# Patient Record
Sex: Female | Born: 2005 | Race: Black or African American | Hispanic: No | Marital: Single | Smoking: Never smoker
Health system: Southern US, Community
[De-identification: ages and names within clinical notes are randomized; demographics above are authoritative.]

## PROBLEM LIST (undated history)

## (undated) DIAGNOSIS — Z789 Other specified health status: Secondary | ICD-10-CM

## (undated) HISTORY — PX: NO PAST SURGERIES: SHX2092

## (undated) HISTORY — DX: Other specified health status: Z78.9

---
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
## 2023-10-15 ENCOUNTER — Encounter: Payer: Self-pay | Admitting: Emergency Medicine

## 2023-10-15 ENCOUNTER — Ambulatory Visit
Admission: EM | Admit: 2023-10-15 | Discharge: 2023-10-15 | Disposition: A | Discharge status: Home / Self Care | Payer: BLUE CROSS/BLUE SHIELD | Attending: Internal Medicine | Admitting: Internal Medicine

## 2023-10-15 DIAGNOSIS — J029 Acute pharyngitis, unspecified: Secondary | ICD-10-CM | POA: Diagnosis present

## 2023-10-15 DIAGNOSIS — B349 Viral infection, unspecified: Secondary | ICD-10-CM | POA: Diagnosis present

## 2023-10-15 DIAGNOSIS — R051 Acute cough: Secondary | ICD-10-CM | POA: Diagnosis not present

## 2023-10-15 LAB — POC COVID19/FLU A&B COMBO
Covid Antigen, POC: NEGATIVE
Influenza A Antigen, POC: NEGATIVE
Influenza B Antigen, POC: NEGATIVE

## 2023-10-15 LAB — POCT RAPID STREP A (OFFICE): Rapid Strep A Screen: NEGATIVE

## 2023-10-15 MED ORDER — PROMETHAZINE-DM 6.25-15 MG/5ML PO SYRP: 5.0000 mL | ORAL_SOLUTION | Freq: Four times a day (QID) | ORAL | 0 refills | Status: DC | PRN

## 2023-10-15 NOTE — ED Triage Notes (Signed)
Patient c/o nasal drainage, sore throat, headache, body ache, fever x 2 days.  Patient has taken Thera-Flu and BC Headache powder.

## 2023-10-15 NOTE — ED Provider Notes (Signed)
[facility]    CSN: [id number] Arrival date & time: 10/15/23  1118      History   Chief Complaint Chief Complaint  Patient presents with   Sore Throat    HPI Amber Everett is a 17 y.o. female  presents for evaluation of URI symptoms for 2 days. Patient reports associated symptoms of cough, congestion, sore throat, body aches, headache, low-grade fevers. Denies N/V/D, ear pain, shortness of breath. Patient does not have a hx of asthma. Patient is not an active smoker. Pt has taken BC powder and TheraFlu OTC for symptoms. Pt has no other concerns at this time.    Sore Throat Associated symptoms include headaches.    Past Medical History:  Diagnosis Date   No pertinent past medical history     There are no active problems to display for this patient.   Past Surgical History:  Procedure Laterality Date   NO PAST SURGERIES      OB History     Gravida  0   Para  0   Term  0   Preterm  0   AB  0   Living  0      SAB  0   IAB  0   Ectopic  0   Multiple  0   Live Births  0            Home Medications    Prior to Admission medications   Medication Sig Start Date End Date Taking? Authorizing Provider  promethazine-dextromethorphan (PROMETHAZINE-DM) 6.25-15 MG/5ML syrup Take 5 mLs by mouth 4 (four) times daily as needed for cough. 10/15/23  Yes Radford Pax, NP    Family History Family History  Problem Relation Age of Onset   Diabetes Mother    Heart failure Maternal Grandmother    Diabetes Maternal Grandmother    Cancer Maternal Grandfather        pancreatic    Social History Social History   Tobacco Use   Smoking status: Former    Types: Cigarettes   Smokeless tobacco: Never  Vaping Use   Vaping status: Never Used  Substance Use Topics   Alcohol use: No   Drug use: Never     Allergies   Patient has no known allergies.   Review of Systems Review of Systems  Constitutional:  Positive for fever.  HENT:   Positive for congestion and sore throat.   Respiratory:  Positive for cough.   Musculoskeletal:  Positive for myalgias.  Neurological:  Positive for headaches.     Physical Exam Triage Vital Signs ED Triage Vitals  Encounter Vitals Group     BP 10/15/23 1145 111/70     Systolic BP Percentile --      Diastolic BP Percentile --      Pulse Rate 10/15/23 1145 69     Resp 10/15/23 1145 18     Temp 10/15/23 1145 98.8 F (37.1 C)     Temp Source 10/15/23 1145 Oral     SpO2 10/15/23 1145 99 %     Weight 10/15/23 1146 110 lb (49.9 kg)     Height 10/15/23 1146 5\' 6"  (1.676 m)     Head Circumference --      Peak Flow --      Pain Score 10/15/23 1146 0     Pain Loc --      Pain Education --      Exclude from Growth Chart --    No  data found.  Updated Vital Signs BP 111/70 (BP Location: Right Arm)   Pulse 69   Temp 98.8 F (37.1 C) (Oral)   Resp 18   Ht 5\' 6"  (1.676 m)   Wt 110 lb (49.9 kg)   LMP 09/17/2023 (Exact Date)   SpO2 99%   BMI 17.75 kg/m   Visual Acuity Right Eye Distance:   Left Eye Distance:   Bilateral Distance:    Right Eye Near:   Left Eye Near:    Bilateral Near:     Physical Exam Vitals and nursing note reviewed.  Constitutional:      General: She is not in acute distress.    Appearance: She is well-developed. She is not ill-appearing.  HENT:     Head: Normocephalic and atraumatic.     Right Ear: Tympanic membrane and ear canal normal.     Left Ear: Tympanic membrane and ear canal normal.     Nose: Congestion present.     Mouth/Throat:     Mouth: Mucous membranes are moist.     Pharynx: Oropharynx is clear. Uvula midline. Posterior oropharyngeal erythema present.     Tonsils: No tonsillar exudate or tonsillar abscesses.  Eyes:     Conjunctiva/sclera: Conjunctivae normal.     Pupils: Pupils are equal, round, and reactive to light.  Cardiovascular:     Rate and Rhythm: Normal rate and regular rhythm.     Heart sounds: Normal heart sounds.   Pulmonary:     Effort: Pulmonary effort is normal.     Breath sounds: Normal breath sounds.  Musculoskeletal:     Cervical back: Normal range of motion and neck supple.  Lymphadenopathy:     Cervical: No cervical adenopathy.  Skin:    General: Skin is warm and dry.  Neurological:     General: No focal deficit present.     Mental Status: She is alert and oriented to person, place, and time.  Psychiatric:        Mood and Affect: Mood normal.        Behavior: Behavior normal.      UC Treatments / Results  Labs (all labs ordered are listed, but only abnormal results are displayed) Labs Reviewed  CULTURE, GROUP A STREP [facility])  POCT RAPID STREP A (OFFICE)  POC COVID19/FLU A&B COMBO    EKG   Radiology No results found.  Procedures Procedures (including critical care time)  Medications Ordered in UC Medications - No data to display  Initial Impression / Assessment and Plan / UC Course  I have reviewed the triage vital signs and the nursing notes.  Pertinent labs & imaging results that were available during my care of the patient were reviewed by me and considered in my medical decision making (see chart for details).     Reviewed exam and symptoms with patient.  No red flags.  Negative rapid flu, COVID, strep testing.  Will send strep throat culture.  Discussed viral illness and symptomatic treatment.  Promethazine DM as needed for cough, side effect profile reviewed.  PCP follow-up as symptoms do not improve.  ER precautions reviewed. Final Clinical Impressions(s) / UC Diagnoses   Final diagnoses:  Sore throat  Acute cough  Viral illness     Discharge Instructions      Start Promethazine DM as needed for cough.  Please of this medication make you drowsy.  Do not drink alcohol or drive on this medication.  Lots of rest and fluids.  Continue Tylenol  or ibuprofen as needed for fever management.  Please follow-up with your PCP if your symptoms do not improve.   Please go to the ER if you develop any worsening symptoms.  I hope you feel better soon!     ED Prescriptions     Medication Sig Dispense Auth. Provider   promethazine-dextromethorphan (PROMETHAZINE-DM) 6.25-15 MG/5ML syrup Take 5 mLs by mouth 4 (four) times daily as needed for cough. 118 mL Radford Pax, NP      PDMP not reviewed this encounter.   Radford Pax, NP 10/15/23 [phone]

## 2023-10-15 NOTE — Discharge Instructions (Addendum)
Start Promethazine DM as needed for cough.  Please of this medication make you drowsy.  Do not drink alcohol or drive on this medication.  Lots of rest and fluids.  Continue Tylenol or ibuprofen as needed for fever management.  Please follow-up with your PCP if your symptoms do not improve.  Please go to the ER if you develop any worsening symptoms.  I hope you feel better soon!

## 2023-10-18 LAB — CULTURE, GROUP A STREP (THRC)

## 2024-05-17 ENCOUNTER — Other Ambulatory Visit: Payer: Self-pay

## 2024-05-17 ENCOUNTER — Ambulatory Visit (INDEPENDENT_AMBULATORY_CARE_PROVIDER_SITE_OTHER): Admitting: Obstetrics and Gynecology

## 2024-05-17 VITALS — BP 105/66 | HR 78 | Ht 66.25 in | Wt 116.9 lb

## 2024-05-17 DIAGNOSIS — O219 Vomiting of pregnancy, unspecified: Secondary | ICD-10-CM

## 2024-05-17 DIAGNOSIS — Z3A01 Less than 8 weeks gestation of pregnancy: Secondary | ICD-10-CM

## 2024-05-17 DIAGNOSIS — Z3201 Encounter for pregnancy test, result positive: Secondary | ICD-10-CM | POA: Diagnosis not present

## 2024-05-17 LAB — POCT PREGNANCY, URINE: Preg Test, Ur: POSITIVE — AB

## 2024-05-17 MED ORDER — PROMETHAZINE HCL 25 MG PO TABS: 25.0000 mg | ORAL_TABLET | Freq: Four times a day (QID) | ORAL | 0 refills | Status: AC | PRN

## 2024-05-17 NOTE — Patient Instructions (Addendum)
 Prenatal Care Providers           Center for [facility] @ [facility]  [address] [phone]  Center for [facility] @ [facility]   [address]  [phone]  Center For [facility] @ [facility]       [address] [phone]            Center for [facility] @ [place]     [phone]          Center for [facility] @ [facility]   [address] [phone]  Center for [facility] @ [facility]  [address] [phone]     Center for [facility] @ [address] [place])  [address]   [phone]     [facility]  Phone: [phone]  [facility]  Phone: [phone]  [facility] Phone: [phone]  [facility] Phone: [phone]  [facility] Phone: [phone]  [facility] Phone: [phone]  [facility]  Phone: [phone]    CenteringPregnancy is a model of prenatal care that started 30 years ago and is used in about 600 practices around the [place] . You meet with a group of 8-12 women due around the same time as you. In Centering you will have individual time with the provider and meet as a group. There's much more time for discussion and learning. You will actually have much more time with your provider in Centering than in traditional prenatal care.? You will come directly into the Centering room and will not wait in the lobby so there is no wasted time. You will have 2-hour visits every 4 weeks then every 2 weeks. You will know your Centering prenatal appointments in advance. In your last month of pregnancy, you may also come in for some individual visits. Additional appointments can be scheduled if you need more care. Studies have shown that CenteringPregnancy improves birth outcomes. We have seen especially big  improvements in fewer Black women delivering babies who are too small or born too early. Visit the website CenteringHealthcare for more information. Let your provider or clinic staff know if you want to sign up or email [email or web address] for more information.   CenteringPregnancy Video   First Trimester of Pregnancy  The first trimester of pregnancy starts on the first day of your last monthly period until the end of week 13. This is months 1 through 3 of pregnancy. A week after a sperm fertilizes an egg, the egg will implant into the wall of the uterus and begin to develop into a baby. Body changes during your first trimester Your body goes through many changes during pregnancy. The changes usually return to normal after your baby is born. Physical changes Your breasts may grow larger and may hurt. The area around your nipples may get darker. Your periods will stop. Your hair and nails may grow faster. You may pee more often. Health changes You may tire easily. Your gums may bleed and may be sensitive when you brush and floss. You may not feel hungry. You may have heartburn. You may throw up or feel like you may throw up. You may want to eat some foods, but not others. You may have headaches. You may have trouble pooping (constipation). Other changes Your emotions may change from day to day. You may have more dreams. Follow these instructions  at home: Medicines Talk to your health care provider if you're taking medicines. Ask if the medicines are safe to take during pregnancy. Your provider may change the medicines that you take. Do not take any medicines unless told to by your provider. Take a prenatal vitamin that has at least 600 micrograms (mcg) of folic acid. Do not use herbal medicines, illegal substances, or medicines that are not approved by your provider. Eating and drinking While you're pregnant your body needs extra food for your growing baby. Talk with  your provider about what to eat while pregnant. Activity Most women are able to exercise during pregnancy. Exercises may need to change as your pregnancy goes on. Talk to your provider about your activities and exercise routines. Relieving pain and discomfort Wear a good, supportive bra if your breasts hurt. Rest with your legs raised if you have leg cramps or low back pain. Safety Wear your seatbelt at all times when you're in a car. Talk to your provider if someone hits you, hurts you, or yells at you. Talk with your provider if you're feeling sad or have thoughts of hurting yourself. Lifestyle Certain things can be harmful while you're pregnant. Follow these rules: Do not use hot tubs, steam rooms, or saunas. Do not douche. Do not use tampons or scented pads. Do not drink alcohol,smoke, vape, or use products with nicotine or tobacco in them. If you need help quitting, talk with your provider. Avoid cat litter boxes and soil used by cats. These things carry germs that can cause harm to your pregnancy and your baby. General instructions Keep all follow-up visits. It helps you and your unborn baby stay as healthy as possible. Write down your questions. Take them to your visits. Your provider will: Talk with you about your overall health. Give you advice or refer you to specialists who can help with different needs, including: Prenatal education classes. Mental health and counseling. Foods and healthy eating. Ask for help if you need help with food. Call your dentist and ask to be seen. Brush your teeth with a soft toothbrush. Floss gently. Where to find more information American Pregnancy Association: americanpregnancy.org [employer] of [employer]stetricians and Gynecologists: acog.org Office on [employer] Health: TravelLesson.ca Contact a health care provider if: You feel dizzy, faint, or have a fever. You vomit or have watery poop (diarrhea) for 2 days or more. You have abnormal  discharge or bleeding from your vagina. You have pain when you pee or your pee smells bad. You have cramps, pain, or pressure in your belly area. Get help right away if: You have trouble breathing or chest pain. You have any kind of injury, such as from a fall or a car crash. These symptoms may be an emergency. Get help right away. Call 911. Do not wait to see if the symptoms will go away. Do not drive yourself to the hospital. This information is not intended to replace advice given to you by your health care provider. Make sure you discuss any questions you have with your health care provider. Document Revised: 07/07/2023 Document Reviewed: 02/04/2023 Elsevier Patient Education  2024 [employer].

## 2024-05-17 NOTE — Progress Notes (Unsigned)
 Here for pregnancy test which was positive. She reports LMP week of 04/06/24;not sure of date. This makes her aboutr [redacted]w[redacted]d pregnant with EDD 01/11/25. She states she has regular periods. She denies any previous pregnancies. She denies any health issues. I advised she start prenatal care and prenatal vitamins. She hopes to go here or one of [facility]. She c/o nausea and we discussed her options. She would like to try Phenergan , RX sent. We discussed options of prenatal care and she will consider and notify us  at new ob intake. Dr. Izell in to welcome to practice.  Also discussed warning signs of early pregnancy. She voices understanding. Sent to front desk to schedule new ob visits.  Rock [place] PEAK

## 2024-06-21 ENCOUNTER — Telehealth

## 2024-06-21 DIAGNOSIS — Z3491 Encounter for supervision of normal pregnancy, unspecified, first trimester: Secondary | ICD-10-CM

## 2024-06-21 DIAGNOSIS — Z3A11 11 weeks gestation of pregnancy: Secondary | ICD-10-CM

## 2024-06-21 DIAGNOSIS — Z3490 Encounter for supervision of normal pregnancy, unspecified, unspecified trimester: Secondary | ICD-10-CM | POA: Insufficient documentation

## 2024-06-21 MED ORDER — BLOOD PRESSURE KIT DEVI: 1.0000 | Freq: Once | 0 refills | Status: AC

## 2024-06-21 NOTE — Patient Instructions (Signed)

## 2024-06-21 NOTE — Progress Notes (Signed)
 New OB Intake  I connected with Amber Everett  on 06/21/24 at  1:15 PM EDT by MyChart Video Visit and verified that I am speaking with the correct person using two identifiers. Nurse is located at [facility] and pt is located at Home.  I discussed the limitations, risks, security and privacy concerns of performing an evaluation and management service by telephone and the availability of in person appointments. I also discussed with the patient that there may be a patient responsible charge related to this service. The patient expressed understanding and agreed to proceed.  I explained I am completing New OB Intake today. We discussed EDD of 01/06/25 based on LMP of 04/01/24. Pt is G1P0000. I reviewed her allergies, medications and Medical/Surgical/OB history.    Patient Active Problem List   Diagnosis Date Noted   Supervision of low-risk pregnancy 06/21/2024     Concerns addressed today Pt only concern was that she is now sure LMP was 04/01/24, she reported different date at nurse visit by accident.  Dating/ Viability US  scheduled for 06/26/24 at 2:35.  Delivery Plans Plans to deliver at [facility]. Discussed the nature of our practice with multiple providers including residents and students as well as female and female providers. Due to the size of the practice, the delivering provider may not be the same as those providing prenatal care.   Patient is not interested in water birth.  MyChart/Babyscripts MyChart access verified. I explained pt will have some visits in office and some virtually. Babyscripts instructions given and order placed.   Blood Pressure Cuff/Weight Scale Blood pressure cuff ordered for patient to pick-up from [employer]. Explained after first prenatal appt pt will check weekly and document in Babyscripts. Patient does have weight scale.  Anatomy US  Explained first scheduled US  will be around 19 weeks. Anatomy US  scheduled for 08/17/24 at 1:00pm.  Is patient a  CenteringPregnancy candidate?  Declined Declined due to Declined to say    Is patient a Mom+Baby Combined Care candidate?  Declined     Is patient a candidate for Babyscripts Optimization? Patient declined.   First visit review I reviewed new OB appt with patient. Explained pt will be seen by Nidia Daring, NP at first visit 06/28/24 at 2:15pm. Discussed Natera genetic screening with patient. PT desires Panorama and Horizon. Routine prenatal labs to be collected at [facility] Visit.   Last Pap No results found for: DIAGPAP     Noboru Bidinger L Anaisa Radi, RN 06/21/2024  2:07 PM

## 2024-06-26 ENCOUNTER — Other Ambulatory Visit: Payer: Self-pay

## 2024-06-26 ENCOUNTER — Ambulatory Visit (INDEPENDENT_AMBULATORY_CARE_PROVIDER_SITE_OTHER)

## 2024-06-26 ENCOUNTER — Other Ambulatory Visit: Payer: Self-pay | Admitting: Obstetrics and Gynecology

## 2024-06-26 DIAGNOSIS — Z3491 Encounter for supervision of normal pregnancy, unspecified, first trimester: Secondary | ICD-10-CM

## 2024-06-26 DIAGNOSIS — Z3490 Encounter for supervision of normal pregnancy, unspecified, unspecified trimester: Secondary | ICD-10-CM

## 2024-06-26 DIAGNOSIS — Z3A12 12 weeks gestation of pregnancy: Secondary | ICD-10-CM | POA: Diagnosis not present

## 2024-06-28 ENCOUNTER — Ambulatory Visit (INDEPENDENT_AMBULATORY_CARE_PROVIDER_SITE_OTHER): Payer: Self-pay | Admitting: Obstetrics and Gynecology

## 2024-06-28 ENCOUNTER — Other Ambulatory Visit (HOSPITAL_COMMUNITY)
Admission: RE | Admit: 2024-06-28 | Discharge: 2024-06-28 | Disposition: A | Discharge status: Home / Self Care | Source: Ambulatory Visit | Attending: Obstetrics and Gynecology | Admitting: Obstetrics and Gynecology

## 2024-06-28 ENCOUNTER — Other Ambulatory Visit: Payer: Self-pay

## 2024-06-28 ENCOUNTER — Encounter: Payer: Self-pay | Admitting: Obstetrics and Gynecology

## 2024-06-28 VITALS — BP 105/60 | HR 66 | Wt 118.5 lb

## 2024-06-28 DIAGNOSIS — Z3A12 12 weeks gestation of pregnancy: Secondary | ICD-10-CM

## 2024-06-28 DIAGNOSIS — Z3491 Encounter for supervision of normal pregnancy, unspecified, first trimester: Secondary | ICD-10-CM

## 2024-06-28 MED ORDER — ASPIRIN 81 MG PO TBEC: 81.0000 mg | DELAYED_RELEASE_TABLET | Freq: Every day | ORAL | 2 refills | Status: AC

## 2024-06-28 NOTE — Patient Instructions (Signed)
 [facility] Pediatric Providers  Central/Southeast  ([address]) [facility] Manson Passey, MD; Deirdre Priest, MD; Lum Babe, MD; Leveda Anna, MD; McDiarmid, MD; Jerene Bears, MD [address] [place], [place] [address] [phone] Mon-Fri 8:30-12:30, 1:30-5:00  Providers come to see babies during newborn hospitalization Only accepting infants of Mother's who are seen at [facility] or have siblings seen at   [facility] Medicaid - Yes; Tricare - Yes   [facility] [place], MD [address], [place], [place] [address] [phone] Mon, Tue, Thur, Fri 8:30-5:00, Wed 10:00-7:00 (closed 1-2pm daily for lunch) [facility] residents with no insurance.  [place] [employer] only with Medicaid/insurance; Tricare - no  [facility] - [facility], MD; Manson Passey, MD; Ave Filter, MD; Luna Fuse, MD; Kennedy Bucker, MD; Florestine Avers, MD; Melchor Amour, MD; Yetta Barre,  MD; Konrad Dolores, MD; Kathlene November, MD; Jenne Campus, MD; Wynetta Emery, MD; Duffy Rhody, MD; Gerre Couch, NP [address] [place], [place], [place] [address] [phone] Mon, Tue, Thur, Fri 8:30-5:30, Wed 9:30-5:30, Sat 8:30-12:30 Only accepting infants of first-time parents or siblings of current patients Hospital discharge coordinator will make follow-up appointment Medicaid - yes; Tricare - yes  [place] [phone]) [facility] Ilean China, MD; Earlene Plater, MD; Jamesetta Orleans, MD; Alvera Novel, MD; Rana Snare, MD; [facility], MD; Shaaron Adler, MD; Hosie Poisson, MD; Mayford Knife, MD [address], [place], [place] [address] [phone] Mon-Fri 8:30-5:00, closed for lunch 12:30-1:30; Sat-Sun 10:00-1:00 Accepting Newborns with commercial insurance only, must call prior to delivery to be accepted into  practice.  Medicaid - no, Tricare - yes   [facility] [address] [place], [place] [address] [phone] or [phone] Mon to Fri 8am to 10pm, Sat 8am to 1pm  (virtual only on weekends) Only accepts Medicaid Healthy Blue pts  [facility]) - Pediatrics at Elige Radon, MD; Sabino Dick, MD; Quitman Livings, MD; Betha Loa, NP; Claretha Cooper, MD; Lelon Perla, MD [address] [place] [place], [place] [address] [phone] Mon-Fri 8:30-5:30 Medicaid - yes, Tricare - yes  [place] [phone]) [facility] Marcie Mowers, MD [address], [place], [place] [address] [phone] Iona Hansen, Wed Fri 8:30-5:00, Sat 8:30-12:00, Closed Thursdays Accepting siblings of established patients and first time mom's if you call prenatally Medicaid- yes; Tricare - yes  [facility] Lutricia Feil, [place]; Tracie Harrier, MD; Rusty Aus; Scifres, PA; Wynelle Link, MD; Azucena Cecil, MD;  [address], [place], [place] [address] [phone] Mon-Fri 8:30-5:00, closed for lunch 1-2 Only accepting newborns of established patients Medicaid- no; Tricare - yes  [facility] [phone]) [facility] Morene Crocker, MD; [address], [place], [place] [address] [phone] Mon-Fri 8:00-5:00 Medicaid - No; Tricare - Yes  [facility], [place]; [place], [place] [address] [place], [place] [address] [phone] Mon-Fri 8:00-5:00 Medicaid - No, Tricare - Yes  [facility] Cardell Peach, MD; Nash Dimmer, MD; [place], [place] [address] [place], [place] [address] [phone]  Mon-Fri 8:00-5:00 Medicaid - No; Tricare - Yes  [facility] [address], [place], [place] [address] [phone] Mon-Fri 8:30-5:00 (lunch 12:00-1:00) Medicaid -Yes; Tricare - Yes  [facility] [place], MD [address] [place], [place], [place] [address] [phone] Mon-Fri 8:00-5:00 Seeing newborns of current patients only. No new patients Medicaid - No, Tricare - yes  Nature conservation officer at [place] [address], MD [address] [place], [place] [address] [phone] Mon-Fri 8:00-5:00 Medicaid -yes as secondary coverage only;  Tricare - yes  [facility] [place], [place]; [place], [place]; Avis Epley, MD; Vonna Kotyk, MD; Clance Boll, MD; [place], [place]; Smoot, NP; Vaughan Basta, MD; Damon, MD [address] [place], [place] [address] [phone] Mon-Fri 8:30-5:00, Sat 9:00-11:00 Accepts commercial insurance ONLY. Offers free prenatal information sessions for families. Medicaid - No, Tricare - Call first  [facility] [place], MD; [place], [place]; [place], [place]; [place], [place] [address] [place] [address] [phone] Mon-Fri 7:30-5:30 Medicaid - Yes; Ailene Rud yes  [place] [phone] & [phone])  [facility], MD [address], [place], [place] [address] [phone] Mon-Thur 8:00-6:00, closed for lunch 12-2, closed Fridays Medicaid - yes; Tricare - no  [facility] Dareen Piano, NP; Cyndia Bent, MD; [place], [place]; [place], [place] [address], [place], [place] [address] [phone] Mon-Fri 7:30-4:30 Medicaid - yes, Tricare - yes  [facility]  Juanito Doom, MD; Janene Harvey, NP; Vonita Moss, MD; Donn Pierini, NP [address], [place], [place] [address] [phone] Mon-Fri 8:30-5:00, closed for lunch 1-2, Sat 8:30-12:00 - sick visits only Providers come to see babies at [facility] Only accepting newborns of siblings and first time parents ONLY if who have met with office prior to delivery Medicaid -Yes; Tricare - yes  [facility] - [place], [place]; Spero Geralds, NP; Earlene Plater, MD; Lucretia Roers, MD:  [address], [place], [place] [address] [phone] Mon- Fri 8:00-5:00, Sat 9:00-12:00 - sick visits only Accepting siblings of established patients and first time mom/baby Medicaid - Yes; Tricare - yes Patients must have vaccinations (baby vaccines)  [place] [phone] &  [phone])  Adult nurse Health[facility] at [facility] [address] [place], [place] [address] [phone] Mon-Fri 8:00-5:00 Medicaid - no; Tricare - yes  [facility] Hardin, MD; [place], [place]; [place], [place] [address], [place], [place] [address] [phone] Mon-Fri 8:00-5:00 Medicaid- yes; Tricare - yes  [facility] - Ardeen Jourdain, MD; Yetta Barre, NP; [place], [place] [address] [place], [place], [place] [address] [phone] Mon-Fri 8:00-5:00 Medicaid - Yes; Tricare - yes  [address] [place] [phone])  [facility] [place], [place]; [place], [place]; Eddie Candle, MD; Normand Sloop, MD; [place], NP; [place]enhour, DO; [place], [place]; Constance Goltz, MD; Ruthann Cancer, MD; Vear Clock, MD; [place], [place]; [place], [place]; [place], [place] [address] [facility] [address], [place], [place] [address] [phone] Mon-Fri 8:30-5:00, Sat 9:00-12:00 - sick only Please register online [email or web address] then schedule online or call office Medicaid-Yes; Tricare -yes  [facility] - Premier  D[place], MD; Romualdo Bolk, MD; [place], MD; [place], NP; [place], [place]; Antonietta Barcelona, MD; Mayford Knife, NP; Shelva Majestic, MD [address], [place], [place] [address] [phone] Mon-Fri 8:00-5:30, Sat&Sun by appointment (phones open at 8:30) Medicaid - Yes; Tricare - yes  [place] [phone] & [phone]) [facility] Mariel Aloe; [place], MD; Roger Shelter, MD; Arvilla Market, NP; [place], DO [address], [place], [place] [address] [phone] M-F 8:00 - 5:15, Sat/Sun 9-12 sick visits only Medicaid - No; Tricare - yes  [facility]  [place], PA-C; [place], PA-C; [place], DO; [place], PA-C; [place], PA-C; Roselyn Bering, MD [address], [place], [place] [address] [phone] Mon-Thur 8:00-7:00, Fri 8:00-5:00 Accepting Medicaid for 13 and under only   [facility] Family Medicine  at [place] [facility] [place]) [place], [place]; List, FNP; Berneda Rose, MD; [place], PA-C; [place],  MD; Kellie Simmering, FNP; Genevie Cheshire, FNP; Evaristo Bury, MD; Berneda Rose, MD [phone] [address] [place], [place] [address] [phone] Mon-Fri 8:30-5:30 Medicaid - Yes; Tricare - yes  [facility] - [address]  [place], [place]; Whitney Post, MD; Hennie Duos, MD; Wynne Dust, MD; Hunter, NP [address], [place], [place] [address] [phone] Mon-Thur 8:00-5:30, Fri 8:00-5:00, Sat 9:00-12:00 Medicaid - yes, Tricare - yes  [place] [phone])  [facility], [place]; Lenise Arena, MD; [place], [place] [address], [place], [place] [address] [phone] Mon-Fri 8:00-5:00, closed for lunch 12-1 Medicaid - No; Tricare - yes  Nature conservation officer at [facility], MD [address] [place], [place] [address] [phone] Mon-Fri 8:00-5:00 Medicaid - No; Tricare - yes  [facility], MD; Tami Ribas, MD; Mariam Dollar, MD; Yetta Barre, MD [address], [place], [place] [address] [phone] Mon-Fri 8:00-5:00 Medicaid- Yes; Tricare - yes  [place] [phone])  Adult nurse Health[facility] at [facility], [place]; [place], MD [address] [place] [address] [place], [place], [place] [address] [phone] Mon-Fri 8:00-5:00 Medicaid - No; Tricare - yes  [facility] Family Medicine - Whitney Post - CPNP [address] [place], [place], [place] [address] [phone] Mon-Weds 8:00-6:00, Thurs-Fri 8:00-5:00, Sat 9:00-12:00 Medicaid - yes; Tricare - yes   [facility] Katharina Caper, MD; [place], [place] [address] [place], [place] [address] [phone] Mon-Fri 8:00-5:00 Medicaid - yes; Tricare - yes  [facility] Pediatric Providers  [facility] [address], [place], [place] [address] [phone] Sheral Flow: 8am -8pm, Tues, Weds: 8am - 5pm; Fri: 8-1 Medicaid - Yes; Tricare -  yes  [facility] Rachel Bo, MD; Laural Benes, MD; Anner Crete, MD; [place], [place]; [place], [place] [address], [place], [place] [address] [phone] M-F 8:30 - 5:00 Medicaid - Call office; Tricare -yes  [facility] Edson Snowball, MD; Shanon Rosser, MD, Chelsea Primus, MD; Shirlyn Goltz, PNP; Wardell Heath, NP [phone] [address] [place], [place] [address] [phone] M-F 8:30 - 5:00, Sat/Sun 8:30 - 12:30 (sick visits) Medicaid - Call office; Tricare -yes  [facility] Melvyn Neth, MD; Karl Luke, PNP; Princess Bruins, MD; [place], [place]; [place], NP; Cynda Familia [address], [place], [place] [address] [phone] M-F 8:30 - 5:00 Medicaid - Call office; Tricare - yes  [facility] Jesusita Oka, MD; Dierdre Highman, MD; Earnest Conroy, MD; Timothy Lasso, MD; Nogo, MD [phone] [address], [place], [place] [address] [phone] M-Thur: 8:00 - 5:00; Fri: 8:00 - 4:00 Medicaid - yes; Tricare - yes  [facility] [address] [place], [place] [address] [phone] M-F: 8:30- 5:00, closed for lunch 12:30 - 1:00 Medicaid - yes; Tricare -yes  [facility] [address] [place], [place] [address] [phone] M-F 8:00 - 5:00 Medicaid - yes; Tricare - yes  [place] [facility] [place], DO; Berkey, DO; Sheldon, NP [address] [place], [place] [address] [phone] M-F 8:00 - 5:00, Closed 12-1 for lunch Medicaid - Call; Tricare - yes  [facility] Meredith Mody, MD [address], [place], [place] [address] [phone] M-F: 8:00-5:00, Sat: 8:00 - noon Medicaid - call; Tricare -yes  [facility] Pediatric Providers  [facility] [place], [place] [address] [place], [place], [place] [address] [phone] M-W: 8:00-5:00, Thur: 8:00 - 7:00, Fri: 8:00 - noon Medicaid - yes; Tricare - yes  [facility] - Quay Burow, FNP [address], [place],  [place] [address] [phone] M-F 8:00 - 5:00, Closed for lunch 12-1 Medicaid -  yes; Tricare - yes  [facility] Pediatric Providers  [facility] [place], [place], Alinda Money, MD, [place], FNP-C [address], [facility], [address], [place], [place] [address] [phone] M-T 8:00-5:00, Wed-Fri 7:00-6:00 Medicaid - Yes; Tricare -yes  [facility] at [facility], [place]; [address], [address] Saner [place], [place] [address] [phone] M-F 8:00 - 5:00, closed for lunch 12-1 Medicaid - Yes; Tricare - yes  [facility] and Internal Medicine  Zachery Dauer, MD; Gladstone Lighter, MD; Collie Siad, MD; Freda Jackson, MD; Rich Number, MD; Darryl Nestle, MD; Melinda Crutch, MD, Audria Nine, MD; Tawanna Cooler, MD; Steffanie Dunn, MD; Byrd Hesselbach, MD; Lucretia Roers, MD [address] [place] [address], [place], [place] [address] [phone] M-F 8:00-5:00 Medicaid - yes; Tricare - yes  [facility] [place], MD (speaks [place] and Hindi) [address] [place] [address] [place], [place] [address] [phone] M-F: 8:30 - 5:00, closed 12:30 - 1 for lunch Medicaid - Yes; Tricare -yes  [facility] Pediatric Providers  [facility] Shanda Bumps, MD; Chanetta Marshall, MD; Laurell Josephs, MD [address], [place], [place] [address] [phone] M-Th: 8:00 - 5:30, Fri: 8:00 - 12:00 Medicaid - yes; Tricare - yes  [facility] at [facility], NP; Thora Lance, MD; Orrin Brigham, MD [phone] [address] [place], [place] [address] [phone] M-F: 8:00 - 5:00 Medicaid - yes; Tricare - yes  [facility] [place], NP; Orson Slick, NP; Salley Scarlet, NP; Linton Flemings, MD; Mayford Knife, MD, [place], NP, Emelda Fear, MD; Nida Boatman [address] [place], [place] [address] [phone] M-F: 8:30 - 5:30p Medicaid - yes; Tricare - yes Other locations available as well  [facility], MD; Andrey Campanile, MD; Neville Route, PA-C [address] [place], [place] [address] [phone] M-W: 8:00am - 7:00pm, Thurs: 8:00am - 8:00pm; Fri: 8:00am -  5:00pm, closed daily from 12-1 for lunch Medicaid - yes; Tricare - yes  [facility] Pediatric Providers  [facility] at Levin Erp, MD; Aggie Cosier, FNP; Bland Span, MD; Tristan Schroeder, MD; [place], PNP; Alesia Banda; [place], [place]; Julian Reil, MD;  [address], [place], [place] [address] [phone] Judie Petit - Caleen [place]: 8am - 5pm, Sat 9-noon Medicaid - Yes; Tricare -yes  [facility] at Jaclynn Guarneri, MD; Yetta Barre, FNP; Lilian Kapur, MD; Mariam Dollar, MD [address] [place], [place] [phone] M-F 8:00 - 5:00 Medicaid - call; Tricare - yes  [facility]- Cruz Condon, MD; [place], [place]; Delora Fuel, MD; Dareen Piano, MD; Trudee Grip, MD; Kizzie Ide, MD; Zebedee Iba; Birdena Crandall, MD; Hinton Dyer, MD; [place], MD [address], [place], [place] [address] [phone] M-F 8:00am - 5:00pm; Sat. 9:00 - 11:00 Medicaid - yes; Tricare - yes  [facility] at [facility], MD [address] [place], [place] [address] [phone] M-F 8:00 - 5:00 Medicaid - [place] Medicaid only; Tricare - yes  [facility]s - Illene Bolus, MD; Earlene Plater, [place]; Kenyon Ana, MD [address] [place], [place] [address] [phone] M-F 8:00 - 5:00 Medicaid - yes; Tricare - yes  [facility] [address] Pediatrics - Lind Covert, MD; Manson Passey, MD, [facility], MD, [place], MD; [place], MD; Katrinka Blazing, MD; [address] [place], [place] [address] [phone] M-F: 8-5 Medicaid - yes; Tricare - yes  [facility] - Henrietta Hoover, [place]; [place], MD; [address] [place], [place] [address] [phone] M-F 8-5 Medicaid - yes; Tricare - yes  [address] [place] Darrol Poke, MD; Tami Ribas, MD;  [place], MD; [place], DNP; [place], PNP [address], [place], [place] [address] [phone] M-F 8-5 Medicaid - yes; Tricare - yes  [facility] Delories Heinz, MD;  Adrienne Mocha; Ala Bent, MD [address] [place], [place] [address] [phone] M-F 7am - 5 pm Medicaid - call; Tricare - yes  [facility] [place], [place]; Fredia Beets, MD; Roxan Hockey, MD [address] [place], [place] [address] [phone] M-F 8-5 Medicaid - yes; Tricare - yes  [facility] Kae Heller, MD; Sheliah Hatch, MD; Mayford Knife, FNP; Shon Baton, FNP; Tyron Russell, FNP; Ishmael Holter; [facility] - FNP [address] [place], [place] [address] [phone] M-F 8-5 Medicaid- yes; Tricare - yes  [facility]s - Betsy Coder, [place] and Chalmers Guest, MD; Terrial Rhodes, MD; Hulda Humphrey, MD; Roseanne Reno, MD; [place], H. [place]; Ala Dach, MD; Fredia Beets, MD; Dimple Casey, MD [address] [place], [place] [address] [phone] M-F: 8-5, Sat: 9-4, Sun 9-12 Medicaid - yes; Tricare - yes  [facility] Little, PNP; Earlene Plater, PNP [address] [place], [place] [address] [phone] M-F 8 - 5, closed 12-1 for lunch Medicaid - yes; Tricare - yes  [facility] Kathyrn Lass, MD; Hal Neer, MD; Dimple Casey, MD; [place], DO [address] [place], [place] [address] [phone] M-F 8- 5:30 Medicaid - yes; Tricare - yes  [facility]s - Biagio Quint, MD; Rosalia Hammers, MD; Gwenith Daily, MD [address] [place], [place] [address] [phone] Judie Petit: Nicholas Lose; Tues-Fri: 8-5; Sat: 9-12 Medicaid - yes; Tricare - yes  [facility] - Bobbye Morton, MD; Daphane Shepherd, MD; Chestine Spore, MD; Haskell Riling, MD; Kate Sable, MD [address] [place], [place] [address] [phone] Judie PetitMarland Kitchen Nicholas LoseFrancee Nodal: 8-5; Sat: 8:30-12:30 Medicaid - yes; Tricare - yes  [facility] - Beckey Rutter, MD; [place], [place] [address] [place], [place] [address] [phone] Mon-Fri: 8-5 Medicaid - yes;  Tricare - yes  [facility] Pediatrics - [place], CPNP; [place], ; Dimple Casey, MD; Alisa Graff, MD; Cephus Shelling, MD; [address], [place], [place] [address] [phone] M-F: 8-5, closed 1-2 for lunch Medicaid - yes; Tricare - yes  [facility]s - [place] [facility] [place], [place]; [place], [place]; Katrinka Blazing, MD; [place], CPNP; [place], [place]; [place], MD; Earlene Plater, MD [address], [place], [place] [address] [phone] M-Thurs: Nicholas Lose; Fri: 8-6; Sat: 9-12; Sun 2-4 Medicaid - yes; Tricare - yes  [facility] Georgeanna Lea, MD; Evette Cristal, MD; Shea Stakes, FNP; Earney Mallet, DO; [address] [place], [place] [address] [phone] M-F: 8-5 Medicaid - yes; Tricare - yes  [facility] Pediatric Providers  [facility] Family Medicine -Collene Mares, MD; [place], NP [address] [place], [place] [address] [phone] M - Fri: 8am - 5pm, closed for lunch 12-1 Medicaid - Yes; Tricare - yes  [facility] Elinor Parkinson, MD; Victory Dakin, MD; [place], DO; Vinocur, MD;Hall, PA; Clent Ridges, [place]; Orvan Falconer, NP [phone] [address], [place], [place] [address] [phone] M-F 8:00 - 5:00, Sat 8:00 - 11:30 Medicaid - yes; Tricare - yes  [facility] Welton Flakes, MD; [place], MD, [address] MD, [place], MD, [place], MD; [place], NP; [place], [place];  [address], [place], [place] [address] [phone] M-F 8:10am - 5:00pm Medicaid - yes; Tricare - yes  [facility] Clifford, MD; [place], NP [address] [place], [place] [address] [phone] M-F 8:00 - 5:00 Medicaid - [place] Medicaid only; Tricare - yes  [facility] Medicine - Deep [address] [place], [place]; [place], NP [address], [place], [place] [address] [phone] M-F 8:00 - 5:00; Closed for lunch 12 - 1:00 Medicaid - yes;  Tricare - yes  [facility] Belva Crome, MD; Jonita Albee, FNP [address], [place], [place] [address] [phone] Mon 9-5; Tues/Wed 10-5; Thurs 8:30-5; Fri: 8-12:30 Medicaid - yes; Tricare - yes  [facility] Pediatric Providers  [facility]  [place]al, MD; [place], [place] [address] [place], [place] [address] [phone] phone [phone] fax M-F 7:15 - 4:30 Medicaid - yes; Tricare - yes  [place] [facility] Karilyn Cota, MD; [place], DO [address] [place], [place] [address] [phone] M-Fri: 8:30 - 5:00, closed for lunch everyday noon - 1pm Medicaid - Yes; Tricare - yes  [facility] Burdine, MD; Reuel Boom, MD; Dimas [place]as, MD; Neita Carp, MD; [place], [place]; Bonnita Nasuti, [place]; [place], [place]; [place], [place]; [place], [place] [address] [place], [place] [address] [phone] M-Thurs: 7:30am - 7:00pm; Friday 7:30am - 4pm; Sat: 8:00 - 1:00 Medicaid - Yes; Tricare - yes  [place] [facility] Norval Morton, MD; Conni Elliot, MD; Carroll Kinds, MD; [place], DO [address], [place], [place] [address] [phone] M-Thur: 8:00 - 5:00, Fri: 8:00 - Noon Medicaid - yes; Tricare - yes No [place] [employer]  [facility] Dettinger, MD; Nadine Counts, DO; [place], NP; Daphine Deutscher, NP; Lequita Halt, NP; Ellamae Sia, NP; Reginia Forts, NP; Darlyn Read, MD; [place], [place] [address] [place], [place] [address] [phone] M-F 8:00 - 5:00 Medicaid - yes; Tricare - yes  [facility], FNP-C; [place], FNP-C [address] [place], [place] [address] [phone] M, W, R 8:00-5:00, Tues: 8:00am - 7:00pm; Fri 8:00 - noon Medicaid - Yes; Tricare - yes  [facility], MD [address] [place], [place] [address] [phone]  M-Thurs 8:30-5:30, Fri: 8:30-12:30pm Medicaid - Yes; Tricare - N     Safe Medications in Pregnancy   Acne:  Benzoyl Peroxide  Salicylic Acid    Backache/Headache:  Tylenol: 2 regular strength every 4 hours OR               2 Extra strength every 6 hours   Colds/Coughs/Allergies:  Benadryl (alcohol free) 25 mg every 6 hours as needed  Breath right strips  Claritin  Cepacol throat lozenges  Chloraseptic throat spray  Cold-Eeze- up to three times per day  Cough drops, alcohol free  Flonase (by prescription only)  Guaifenesin  Mucinex  Robitussin DM (plain only, alcohol free)  Saline nasal spray/drops  Sudafed (pseudoephedrine) & Actifed * use only after [redacted] weeks gestation and if you do not have high blood pressure  Tylenol  Vicks Vaporub  Zinc lozenges  Zyrtec   Constipation:  Colace  Ducolax suppositories  Fleet enema  Glycerin suppositories  Metamucil  Milk of magnesia  Miralax  Senokot  Smooth move tea   Diarrhea:  Kaopectate  Imodium A-D   *NO pepto Bismol   Hemorrhoids:  Anusol  Anusol HC  Preparation H  Tucks   Indigestion:  Tums  Maalox  Mylanta  Zantac  Pepcid   Insomnia:  Benadryl (alcohol free) 25mg  every 6 hours as needed  Tylenol PM  Unisom, no Gelcaps   Leg Cramps:  Tums  MagGel   Nausea/Vomiting:  Bonine  Dramamine  Emetrol  Ginger extract  Sea bands  Meclizine  Nausea medication to take during pregnancy:  Unisom (doxylamine succinate 25 mg tablets) Take one tablet daily at bedtime. If symptoms are not adequately controlled, the dose can be increased to a maximum recommended dose of two tablets daily (1/2 tablet in the morning, 1/2 tablet mid-afternoon and one at bedtime).  Vitamin B6 100mg  tablets. Take one tablet twice a day (up to 200 mg per day).   Skin Rashes:  Aveeno products  Benadryl cream or 25mg  every 6 hours as needed  Calamine Lotion  1% cortisone cream   Yeast infection:  Gyne-lotrimin 7  Monistat 7    **If taking multiple medications, please check labels to avoid duplicating the same active ingredients  **take medication as directed on the label   ** Do not exceed 4000 mg of tylenol in 24 hours  **Do not take medications that contain aspirin or ibuprofen

## 2024-06-28 NOTE — Progress Notes (Signed)
 INITIAL PRENATAL VISIT  Subjective:   Amber Everett is being seen today for her first obstetrical visit.   She is at [redacted]w[redacted]d gestation by LMP. Her obstetrical history is significant for none. Relationship with FOB: significant other, not living together. Patient does intend to breast feed. Pregnancy history fully reviewed.  Patient reports no complaints.  Indications for ASA therapy (per uptodate)  Two or more of the following: Nulliparity Yes Obesity (body mass index >30 kg/m2) No Family history of preeclampsia in mother or sister No Age >=35 years No Sociodemographic characteristics (African American race, low socioeconomic level) Yes Personal risk factors (eg, previous pregnancy with low birth weight or small for gestational age infant, previous adverse pregnancy outcome [eg, stillbirth], interval >10 years between pregnancies) No  Objective:    Obstetric History OB History  Gravida Para Term Preterm AB Living  1 0 0 0 0 0  SAB IAB Ectopic Multiple Live Births  0 0 0 0 0    # Outcome Date GA Lbr Len/2nd Weight Sex Type Anes PTL Lv  1 Current             Past Medical History:  Diagnosis Date   No pertinent past medical history     Past Surgical History:  Procedure Laterality Date   NO PAST SURGERIES      Current Outpatient Medications on File Prior to Visit  Medication Sig Dispense Refill   Prenatal MV & Min w/FA-DHA (PRENATAL GUMMIES PO) Take 2 tablets by mouth daily.     promethazine  (PHENERGAN ) 25 MG tablet Take 1 tablet (25 mg total) by mouth every 6 (six) hours as needed for nausea or vomiting. 30 tablet 0   promethazine -dextromethorphan (PROMETHAZINE -DM) 6.25-15 MG/5ML syrup Take 5 mLs by mouth 4 (four) times daily as needed for cough. 118 mL 0   No current facility-administered medications on file prior to visit.    No Known Allergies  Social History:  reports that she has never smoked. She has never been exposed to tobacco smoke. She has never used  smokeless tobacco. She reports that she does not currently use alcohol. She reports that she does not currently use drugs after having used the following drugs: Marijuana.  Family History  Problem Relation Age of Onset   Diabetes Mother    Heart failure Maternal Grandmother    Diabetes Maternal Grandmother    Cancer Maternal Grandfather        pancreatic    The following portions of the patient's history were reviewed and updated as appropriate: allergies, current medications, past family history, past medical history, past social history, past surgical history and problem list.  Review of Systems Review of Systems  All other systems reviewed and are negative.    Physical Exam:  BP 105/60   Pulse 66   Wt 118 lb 8 oz (53.8 kg)   LMP 04/01/2024 (Exact Date)   BMI 18.98 kg/m  CONSTITUTIONAL: Well-developed, well-nourished female in no acute distress.  HENT:  Normocephalic, atraumatic.   NECK: Normal range of motion SKIN: Skin is warm and dry. MUSCULOSKELETAL: Normal range of motion NEUROLOGIC: Alert and oriented  PSYCHIATRIC: Normal mood and affect. Normal behavior.  CARDIOVASCULAR: Normal heart rate noted RESPIRATORY: normal effort ABDOMEN: Soft PELVIC:deferred  Fetal Heart Rate (bpm): 154           Assessment:    Pregnancy: G1P0000  1. Encounter for supervision of low-risk pregnancy in first trimester (Primary) BP and FHR normal Discussed recommendation of ASA in  pregnancy, rx sent   - Culture, OB Urine - GC/Chlamydia probe amp ([place])not at [facility] - CBC/D/Plt+RPR+Rh+ABO+RubIgG... - Hemoglobin A1c - PANORAMA PRENATAL TEST - [facility] Basic Panel - aspirin  EC 81 MG tablet; Take 1 tablet (81 mg total) by mouth daily. Start taking when you are [redacted] weeks pregnant for rest of pregnancy for prevention of preeclampsia  Dispense: 300 tablet; Refill: 2  2. [redacted] weeks gestation of pregnancy        Plan:     Initial labs drawn. Prenatal vitamins. Problem list  reviewed and updated. Reviewed in detail the nature of the practice with collaborative care between  Genetic screening discussed: NIPS/First trimester screen/Quad/AFP ordered. Role of ultrasound in pregnancy discussed; Anatomy US : ordered. Weight gain recommendations per IOM guidelines reviewed: underweight/BMI 18.5 or less > 28 - 40 lbs; normal weight/BMI 18.5 - 24.9 > 25 - 35 lbs; overweight/BMI 25 - 29.9 > 15 - 25 lbs; obese/BMI  30 or more > 11 - 20 lbs.  Discussed clinic routines, schedule of care and testing, genetic screening options, involvement of students and residents under the direct supervision of APPs and doctors and presence of female providers. Pt verbalized understanding.  Future Appointments  Date Time Provider Department Center  08/17/2024  1:00 PM [facility] PROVIDER 1 [facility]  08/17/2024  1:30 PM [facility] US3 [facility]    Delores Nidia CROME, FNP

## 2024-06-29 ENCOUNTER — Ambulatory Visit: Payer: Self-pay | Admitting: Obstetrics and Gynecology

## 2024-06-29 DIAGNOSIS — Z3491 Encounter for supervision of normal pregnancy, unspecified, first trimester: Secondary | ICD-10-CM

## 2024-06-29 LAB — CBC/D/PLT+RPR+RH+ABO+RUBIGG...
Antibody Screen: NEGATIVE
Basophils Absolute: 0 x10E3/uL (ref 0.0–0.2)
Basos: 1 %
EOS (ABSOLUTE): 0 x10E3/uL (ref 0.0–0.4)
Eos: 0 %
HCV Ab: NONREACTIVE
HIV Screen 4th Generation wRfx: NONREACTIVE
Hematocrit: 33.5 % — ABNORMAL LOW (ref 34.0–46.6)
Hemoglobin: 11.1 g/dL (ref 11.1–15.9)
Hepatitis B Surface Ag: NEGATIVE
Immature Grans (Abs): 0 x10E3/uL (ref 0.0–0.1)
Immature Granulocytes: 0 %
Lymphocytes Absolute: 2.5 x10E3/uL (ref 0.7–3.1)
Lymphs: 29 %
MCH: 30.2 pg (ref 26.6–33.0)
MCHC: 33.1 g/dL (ref 31.5–35.7)
MCV: 91 fL (ref 79–97)
Monocytes Absolute: 0.7 x10E3/uL (ref 0.1–0.9)
Monocytes: 8 %
Neutrophils Absolute: 5.3 x10E3/uL (ref 1.4–7.0)
Neutrophils: 62 %
Platelets: 214 x10E3/uL (ref 150–450)
RBC: 3.67 x10E6/uL — ABNORMAL LOW (ref 3.77–5.28)
RDW: 11.8 % (ref 11.7–15.4)
RPR Ser Ql: NONREACTIVE
Rh Factor: POSITIVE
Rubella Antibodies, IGG: 1.67 {index} (ref >0.99)
WBC: 8.4 x10E3/uL (ref 3.4–10.8)

## 2024-06-29 LAB — HEMOGLOBIN A1C
Est. average glucose Bld gHb Est-mCnc: 88 mg/dL
Hgb A1c MFr Bld: 4.7 % — ABNORMAL LOW (ref 4.8–5.6)

## 2024-06-29 LAB — HCV INTERPRETATION

## 2024-06-30 LAB — URINE CULTURE, OB REFLEX

## 2024-06-30 LAB — CULTURE, OB URINE

## 2024-07-02 LAB — GC/CHLAMYDIA PROBE AMP (~~LOC~~) NOT AT ARMC
Chlamydia: NEGATIVE
Comment: NEGATIVE
Comment: NORMAL
Neisseria Gonorrhea: NEGATIVE

## 2024-07-06 LAB — PANORAMA PRENATAL TEST FULL PANEL:PANORAMA TEST PLUS 5 ADDITIONAL MICRODELETIONS: FETAL FRACTION: 13.1

## 2024-07-09 LAB — HORIZON CUSTOM: REPORT SUMMARY: POSITIVE — AB

## 2024-07-11 ENCOUNTER — Telehealth: Payer: Self-pay

## 2024-07-11 ENCOUNTER — Encounter: Payer: Self-pay | Admitting: Obstetrics and Gynecology

## 2024-07-11 DIAGNOSIS — D563 Thalassemia minor: Secondary | ICD-10-CM | POA: Insufficient documentation

## 2024-07-11 NOTE — Telephone Encounter (Signed)
 I called Amber Everett and reviewed results and plan of care. I gave her phone number to call [facility] to schedule genetic counseling. I also informed her she can get partner kit in our office or thru [place]. She voices understanding. Rock Skip PEAK

## 2024-07-11 NOTE — Telephone Encounter (Signed)
-----   Message from [facility] sent at 07/11/2024  9:25 AM EDT ----- Genetic screen pos for alpha thal carrier, offer genetic counselor  ----- Message ----- From: Rebecka Memos Lab Results In Sent: 06/29/2024   7:39 AM EDT To: Nidia LITTIE Daring, FNP

## 2024-07-15 DIAGNOSIS — Z3483 Encounter for supervision of other normal pregnancy, third trimester: Secondary | ICD-10-CM | POA: Diagnosis not present

## 2024-07-15 DIAGNOSIS — Z3482 Encounter for supervision of other normal pregnancy, second trimester: Secondary | ICD-10-CM | POA: Diagnosis not present

## 2024-07-25 ENCOUNTER — Other Ambulatory Visit: Payer: Self-pay

## 2024-07-25 ENCOUNTER — Ambulatory Visit

## 2024-07-25 VITALS — BP 100/65 | HR 74 | Wt 119.0 lb

## 2024-07-25 DIAGNOSIS — D563 Thalassemia minor: Secondary | ICD-10-CM | POA: Diagnosis not present

## 2024-07-25 DIAGNOSIS — R519 Headache, unspecified: Secondary | ICD-10-CM

## 2024-07-25 DIAGNOSIS — Z3492 Encounter for supervision of normal pregnancy, unspecified, second trimester: Secondary | ICD-10-CM | POA: Diagnosis not present

## 2024-07-25 DIAGNOSIS — O26892 Other specified pregnancy related conditions, second trimester: Secondary | ICD-10-CM

## 2024-07-25 DIAGNOSIS — Z3A16 16 weeks gestation of pregnancy: Secondary | ICD-10-CM | POA: Diagnosis not present

## 2024-07-25 DIAGNOSIS — Z3491 Encounter for supervision of normal pregnancy, unspecified, first trimester: Secondary | ICD-10-CM

## 2024-07-25 NOTE — Progress Notes (Signed)
 Subjective:  Amber Everett is a 18 y.o. G1P0000 at [redacted]w[redacted]d being seen today for ongoing prenatal care.  She is currently monitored for the following issues for this low-risk pregnancy and has Supervision of low-risk pregnancy and Alpha thalassemia silent carrier on their problem list.  Patient reports fatigue and headache.  Contractions: Not present. Vag. Bleeding: None.  Movement: Absent. Denies leaking of fluid.   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 No migraine dx. Went away for a little bit early in the pregnancy, and then worst again. 2 days in a row headaches this week and also a headache last week. Left temporal region, stabbing quality Drinking gatorade. Tylenol  500mg  helps some.   Stays in bed all day, due to fatigue. Not sleeping well  The following portions of the patient's history were reviewed and updated as appropriate: allergies, current medications, past family history, past medical history, past social history, past surgical history and problem list. Problem list updated.  Objective:   Vitals:   07/25/24 1605  BP: 100/65  Pulse: 74  Weight: 54 kg    Fetal Status: Fetal Heart Rate (bpm): 151   Movement: Absent     General:  Alert, oriented and cooperative. Patient is in no acute distress.  Skin: Skin is warm and dry. No rash noted.   Cardiovascular: Normal heart rate noted  Respiratory: Normal respiratory effort, no problems with respiration noted  Abdomen: Soft, gravid, appropriate for gestational age. Pain/Pressure: Absent     Pelvic: Vag. Bleeding: None     Cervical exam deferred        Extremities: Normal range of motion.  Edema: None  Mental Status: Normal mood and affect. Normal behavior. Normal judgment and thought content.   Assessment and Plan:  Pregnancy: G1P0000 at [redacted]w[redacted]d  1. [redacted] weeks gestation of pregnancy (Primary) - AFP, Serum, Open Spina Bifida  2. Encounter for supervision of low-risk pregnancy in first trimester -reviewed lab  results with patient  3. Alpha thalassemia silent carrier Partner given saliva kit today  4. Headache in pregnancy, antepartum, second trimester Trial Mg Glycinate Discussed sleep hygiene which is likely impacting headaches   Preterm labor symptoms and general obstetric precautions including but not limited to vaginal bleeding, contractions, leaking of fluid and fetal movement were reviewed in detail with the patient. Please refer to After Visit Summary for other counseling recommendations.   Follow up in 4 weeks  Trudy Leeroy NOVAK, MD

## 2024-07-25 NOTE — Patient Instructions (Signed)
 Archer,  It was great to meet you today.  Some recommendations to try for your headaches.  Keep up drinking lots of water!  Try taking magnesium glycinate 400mg  before bedtime. This can help with headaches and sleep.  Work on some good sleep habits to improve your sleep and energy.  -get up in the morning before 10am and sit our take a walk outside to get exposure to morning sunlight. This signals to your body daytime is for wakefulness and nighttime is for sleep  -try to be up out of bed most of the daytime. Ok to take a nap for a limited amount of time if you need.  -Avoid screens in your bedroom/sleep space--you should use this space for sleep/sex only. Do not watch TV or have a TV in your sleep space. Put your phone in a different room when it is time to sleep. Use an alarm clock if you typically use your phone as an alarm.  -get in some movement everyday--walking 30 mins a day 5 times a week is recommended in pregnancy, but other movement counts too. E.g. play some music and dance to it.   Let us  know at your next appointment how your headaches are doing. If they are severe or associated with vision changes/loss of sensation in your body, go to the emergency room.

## 2024-07-28 ENCOUNTER — Ambulatory Visit: Payer: Self-pay

## 2024-07-28 LAB — AFP, SERUM, OPEN SPINA BIFIDA
AFP MoM: 0.87
AFP Value: 38.7 ng/mL
Gest. Age on Collection Date: 16.4 wk
Maternal Age At EDD: 20.4 a
OSBR Risk 1 IN: 10000
Test Results:: NEGATIVE
Weight: 119 [lb_av]

## 2024-08-03 DIAGNOSIS — Z20822 Contact with and (suspected) exposure to covid-19: Secondary | ICD-10-CM | POA: Diagnosis not present

## 2024-08-03 DIAGNOSIS — J069 Acute upper respiratory infection, unspecified: Secondary | ICD-10-CM | POA: Diagnosis not present

## 2024-08-10 DIAGNOSIS — O09899 Supervision of other high risk pregnancies, unspecified trimester: Secondary | ICD-10-CM | POA: Insufficient documentation

## 2024-08-17 ENCOUNTER — Ambulatory Visit

## 2024-08-17 ENCOUNTER — Other Ambulatory Visit

## 2024-08-22 ENCOUNTER — Encounter: Admitting: Obstetrics and Gynecology

## 2024-08-23 ENCOUNTER — Telehealth: Admitting: Advanced Practice Midwife

## 2024-08-23 DIAGNOSIS — Z3492 Encounter for supervision of normal pregnancy, unspecified, second trimester: Secondary | ICD-10-CM

## 2024-08-23 DIAGNOSIS — R252 Cramp and spasm: Secondary | ICD-10-CM | POA: Diagnosis not present

## 2024-08-23 DIAGNOSIS — Z3A20 20 weeks gestation of pregnancy: Secondary | ICD-10-CM | POA: Diagnosis not present

## 2024-08-23 DIAGNOSIS — O99891 Other specified diseases and conditions complicating pregnancy: Secondary | ICD-10-CM

## 2024-08-23 MED ORDER — MAGNESIUM OXIDE -MG SUPPLEMENT 400 (240 MG) MG PO TABS: 400.0000 mg | ORAL_TABLET | Freq: Every day | ORAL | 2 refills | Status: AC

## 2024-08-23 NOTE — Progress Notes (Signed)
   PRENATAL VISIT NOTE Virtual Visit via Video Note  I connected with Amber Everett on 08/28/24 at  4:15 PM EST by a video enabled telemedicine application and verified that I am speaking with the correct person using two identifiers.  Location: Patient: Home Provider: Office   Subjective:  Amber Everett is a 18 y.o. G1P0000 at [redacted]w[redacted]d being seen today for ongoing prenatal care.  She is currently monitored for the following issues for this low-risk pregnancy and has Supervision of low-risk pregnancy; Alpha thalassemia silent carrier; and High risk teen pregnancy on their problem list.   Patient reports leg cramps at night  . Vag. Bleeding: None.  Movement: Present. Denies leaking of fluid.   The following portions of the patient's history were reviewed and updated as appropriate: allergies, current medications, past family history, past medical history, past social history, past surgical history and problem list.   Objective:  There were no vitals filed for this visit. Due to virtual visit.  Fetal Status:     Movement: Present     General:  Alert, oriented and cooperative. Patient is in no acute distress.  Respiratory: Normal respiratory effort, no problems with respiration noted  Mental Status: Normal mood and affect. Normal behavior. Normal judgment and thought content.   Assessment and Plan:  Pregnancy: G1P0000 at [redacted]w[redacted]d 1. [redacted] weeks gestation of pregnancy (Primary)  2. Leg cramps in pregnancy - magnesium oxide (MAG-OX) 400 (240 Mg) MG tablet; Take 1 tablet (400 mg total) by mouth daily.  Dispense: 30 tablet; Refill: 2  3. Encounter for supervision of low-risk pregnancy in second trimester  Preterm labor symptoms and general obstetric precautions including but not limited to vaginal bleeding, contractions, leaking of fluid and fetal movement were reviewed in detail with the patient. Please refer to After Visit Summary for other counseling recommendations.   No follow-ups on  file.  Future Appointments  Date Time Provider Department Center  09/21/2024 10:15 AM Regino, Camie LABOR, CNM [facility]    Bookert Guzzi  Claudene [facility] for [employer]

## 2024-08-24 ENCOUNTER — Ambulatory Visit: Attending: Obstetrics and Gynecology | Admitting: Obstetrics

## 2024-08-24 ENCOUNTER — Other Ambulatory Visit: Payer: Self-pay | Admitting: Obstetrics and Gynecology

## 2024-08-24 ENCOUNTER — Ambulatory Visit (HOSPITAL_BASED_OUTPATIENT_CLINIC_OR_DEPARTMENT_OTHER)

## 2024-08-24 VITALS — BP 104/58 | HR 75

## 2024-08-24 DIAGNOSIS — Z3492 Encounter for supervision of normal pregnancy, unspecified, second trimester: Secondary | ICD-10-CM

## 2024-08-24 DIAGNOSIS — O09892 Supervision of other high risk pregnancies, second trimester: Secondary | ICD-10-CM | POA: Diagnosis not present

## 2024-08-24 DIAGNOSIS — D563 Thalassemia minor: Secondary | ICD-10-CM | POA: Insufficient documentation

## 2024-08-24 DIAGNOSIS — Z3689 Encounter for other specified antenatal screening: Secondary | ICD-10-CM | POA: Diagnosis not present

## 2024-08-24 DIAGNOSIS — Z3490 Encounter for supervision of normal pregnancy, unspecified, unspecified trimester: Secondary | ICD-10-CM

## 2024-08-24 DIAGNOSIS — Z3A20 20 weeks gestation of pregnancy: Secondary | ICD-10-CM | POA: Diagnosis not present

## 2024-08-24 DIAGNOSIS — O280 Abnormal hematological finding on antenatal screening of mother: Secondary | ICD-10-CM | POA: Diagnosis not present

## 2024-08-24 NOTE — Progress Notes (Signed)
 MFM Consult Note  Amber Everett is currently at 20 weeks and 5 days.  She was seen for a detailed fetal anatomy scan due to a teenage pregnancy.    Her [employer] screening test indicated that she is a silent carrier for alpha thalassemia.  Her partner reports that he was screened and was found not to be a carrier for alpha thalassemia.  She denies any significant past medical history and denies any problems in her current pregnancy.    She had a cell free DNA test earlier in her pregnancy which indicated a low risk for trisomy 52, 52, and 13. A female fetus is predicted.   Sonographic findings Single intrauterine pregnancy at 20w 5d. Fetal cardiac activity:  Observed and appears normal. Presentation: Cephalic. The anatomic structures that were well seen appear normal. The anatomic survey is complete.  Fetal biometry shows the estimated fetal weight at the 48th percentile. Amniotic fluid: Within normal limits.  MVP: 6.43 cm. Placenta: Anterior. Adnexa: No abnormality visualized. Cervical length: 3.3 cm.  The patient was informed that anomalies may be missed due to technical limitations. If the fetus is in a suboptimal position or maternal habitus is increased, visualization of the fetus in the maternal uterus may be impaired.  Silent carrier for alpha thalassemia The patient was advised that alpha thalassemia is inherited in an autosomal recessive fashion.  She was reassured that as her partner was screened and found not to be a carrier for alpha thalassemia, their child's risk of being affected by alpha thalassemia is low.  As the views of the fetal anatomy were visualized today, no further exams were scheduled in our office.    The patient stated that all of her questions were answered today.  A total of 30 minutes was spent counseling and coordinating the care for this patient.  Greater than 50% of the time was spent in direct face-to-face contact.

## 2024-09-06 DIAGNOSIS — R519 Headache, unspecified: Secondary | ICD-10-CM | POA: Diagnosis not present

## 2024-09-21 ENCOUNTER — Encounter: Admitting: Certified Nurse Midwife
